# Patient Record
Sex: Male | Born: 1945 | Race: White | Hispanic: No | Marital: Married | Smoking: Former smoker
Health system: Southern US, Community
[De-identification: ages and names within clinical notes are randomized; demographics above are authoritative.]

## PROBLEM LIST (undated history)

## (undated) DIAGNOSIS — E785 Hyperlipidemia, unspecified: Secondary | ICD-10-CM

## (undated) DIAGNOSIS — I10 Essential (primary) hypertension: Secondary | ICD-10-CM

## (undated) DIAGNOSIS — E119 Type 2 diabetes mellitus without complications: Secondary | ICD-10-CM

## (undated) HISTORY — PX: EYE SURGERY: SHX253

## (undated) HISTORY — PX: BACK SURGERY: SHX140

---

## 2005-01-12 DIAGNOSIS — I214 Non-ST elevation (NSTEMI) myocardial infarction: Secondary | ICD-10-CM

## 2005-01-12 HISTORY — PX: CARDIAC SURGERY: SHX584

## 2005-01-12 HISTORY — DX: Non-ST elevation (NSTEMI) myocardial infarction: I21.4

## 2005-01-26 ENCOUNTER — Ambulatory Visit: Payer: Self-pay | Admitting: Cardiology

## 2005-01-26 ENCOUNTER — Inpatient Hospital Stay (HOSPITAL_COMMUNITY): Admission: EM | Admit: 2005-01-26 | Discharge: 2005-02-06 | Payer: Self-pay | Admitting: Cardiology

## 2005-02-27 ENCOUNTER — Ambulatory Visit: Payer: Self-pay | Admitting: Cardiovascular Disease

## 2005-04-01 ENCOUNTER — Ambulatory Visit: Payer: Self-pay | Admitting: Cardiology

## 2006-03-10 ENCOUNTER — Ambulatory Visit: Payer: Self-pay | Admitting: Cardiology

## 2006-03-19 ENCOUNTER — Ambulatory Visit: Payer: Self-pay | Admitting: Cardiology

## 2017-04-13 ENCOUNTER — Encounter: Payer: Self-pay | Admitting: *Deleted

## 2017-04-14 ENCOUNTER — Telehealth: Payer: Self-pay | Admitting: Cardiovascular Disease

## 2017-04-14 ENCOUNTER — Encounter: Payer: Self-pay | Admitting: Cardiovascular Disease

## 2017-04-14 ENCOUNTER — Encounter: Payer: Self-pay | Admitting: *Deleted

## 2017-04-14 ENCOUNTER — Ambulatory Visit (INDEPENDENT_AMBULATORY_CARE_PROVIDER_SITE_OTHER): Payer: Medicare Other | Admitting: Cardiovascular Disease

## 2017-04-14 VITALS — BP 128/78 | HR 63 | Ht 71.0 in | Wt 238.0 lb

## 2017-04-14 DIAGNOSIS — Z01818 Encounter for other preprocedural examination: Secondary | ICD-10-CM

## 2017-04-14 DIAGNOSIS — E785 Hyperlipidemia, unspecified: Secondary | ICD-10-CM | POA: Diagnosis not present

## 2017-04-14 DIAGNOSIS — Z951 Presence of aortocoronary bypass graft: Secondary | ICD-10-CM | POA: Diagnosis not present

## 2017-04-14 DIAGNOSIS — I25708 Atherosclerosis of coronary artery bypass graft(s), unspecified, with other forms of angina pectoris: Secondary | ICD-10-CM

## 2017-04-14 DIAGNOSIS — I10 Essential (primary) hypertension: Secondary | ICD-10-CM

## 2017-04-14 NOTE — Progress Notes (Signed)
CARDIOLOGY CONSULT NOTE  Patient ID: Allen Hill. MRN: [id number] DOB/AGE: 72/24/1947 72 y.o.  Admit date: (Not on file) Primary Physician: Dr. Matthias Hughs Referring Physician: Dr. Noreene Larsson  Reason for Consultation: Preoperative risk stratification  HPI: Allen Hill. is a 72 y.o. male who is being seen today for the evaluation of preoperative risk stratification at the request of Voos, Synetta Shadow, MD.   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  He also has hypertension and hyperlipidemia.  He has chronic lower back pain which has failed epidural steroid injections, chiropractic care, and exercise treatment.  He is being considered for multilevel lumbar decompression from the L2 down to the sacrum.  ECG performed today demonstrates sinus rhythm with old inferolateral infarct and inferolateral T wave inversions.  The patient denies any symptoms of chest pain, palpitations, shortness of breath, lightheadedness, dizziness, leg swelling, orthopnea, PND, and syncope.  He underwent four-vessel CABG on 01/26/2005.  He brought me his operative report.  He had a LIMA to the LAD, SVG to PDA, and SVG to OM1 and OM 2.   No Known Allergies  Current Outpatient Medications  Medication Sig Dispense Refill  . aspirin EC 81 MG tablet Take 81 mg by mouth daily.    . celecoxib (CELEBREX) 100 MG capsule Take 100 mg by mouth daily.    . DULoxetine (CYMBALTA) 60 MG capsule Take 60 mg by mouth daily.    Marland Kitchen ezetimibe (ZETIA) 10 MG tablet Take 10 mg by mouth daily.    Marland Kitchen lisinopril (PRINIVIL,ZESTRIL) 20 MG tablet Take 20 mg by mouth daily.    . metoprolol succinate (TOPROL-XL) 50 MG 24 hr tablet Take 25 mg by mouth daily. Take with or immediately following a meal.     . OXYCODONE HCL PO Take 5 mg by mouth.    . prazosin (MINIPRESS) 2 MG capsule Take 2 mg by mouth at bedtime.    . simvastatin (ZOCOR) 80 MG tablet Take 40 mg by mouth daily.      No current  facility-administered medications for this visit.     History reviewed. No pertinent past medical history.  History reviewed. No pertinent surgical history.  Social History   Socioeconomic History  . Marital status: Married    Spouse name: Not on file  . Number of children: Not on file  . Years of education: Not on file  . Highest education level: Not on file  Occupational History  . Not on file  Social Needs  . Financial resource strain: Not on file  . Food insecurity:    Worry: Not on file    Inability: Not on file  . Transportation needs:    Medical: Not on file    Non-medical: Not on file  Tobacco Use  . Smoking status: Former Smoker    Types: Cigarettes  . Smokeless tobacco: Current User  . Tobacco comment: quit 13 years ago   Substance and Sexual Activity  . Alcohol use: Not on file  . Drug use: Not on file  . Sexual activity: Not on file  Lifestyle  . Physical activity:    Days per week: Not on file    Minutes per session: Not on file  . Stress: Not on file  Relationships  . Social connections:    Talks on phone: Not on file    Gets together: Not on file    Attends religious service: Not on file  Active member of club or organization: Not on file    Attends meetings of clubs or organizations: Not on file    Relationship status: Not on file  . Intimate partner violence:    Fear of current or ex partner: Not on file    Emotionally abused: Not on file    Physically abused: Not on file    Forced sexual activity: Not on file  Other Topics Concern  . Not on file  Social History Narrative  . Not on file     No family history of premature CAD in 1st degree relatives.  Current Meds  Medication Sig  . aspirin EC 81 MG tablet Take 81 mg by mouth daily.  . celecoxib (CELEBREX) 100 MG capsule Take 100 mg by mouth daily.  . DULoxetine (CYMBALTA) 60 MG capsule Take 60 mg by mouth daily.  Marland Kitchen ezetimibe (ZETIA) 10 MG tablet Take 10 mg by mouth daily.  Marland Kitchen  lisinopril (PRINIVIL,ZESTRIL) 20 MG tablet Take 20 mg by mouth daily.  . metoprolol succinate (TOPROL-XL) 50 MG 24 hr tablet Take 25 mg by mouth daily. Take with or immediately following a meal.   . OXYCODONE HCL PO Take 5 mg by mouth.  . prazosin (MINIPRESS) 2 MG capsule Take 2 mg by mouth at bedtime.  . simvastatin (ZOCOR) 80 MG tablet Take 40 mg by mouth daily.       Review of systems complete and found to be negative unless listed above in HPI    Physical exam Blood pressure 128/78, pulse 63, height 5\' 11"  (1.803 m), weight 238 lb (108 kg), SpO2 96 %. General: NAD Neck: No JVD, no thyromegaly or thyroid nodule.  Lungs: Clear to auscultation bilaterally with normal respiratory effort. CV: Nondisplaced PMI. Regular rate and rhythm, normal S1/S2, no S3/S4, no murmur.  No peripheral edema.  No carotid bruit. Abdomen: Soft, nontender, no distention.  Skin: Intact without lesions or rashes.  Neurologic: Alert and oriented x 3.  Psych: Normal affect. Extremities: No clubbing or cyanosis.  HEENT: Normal.   ECG: Most recent ECG reviewed.   Labs: No results found for: K, BUN, CREATININE, ALT, TSH, HGB   Lipids: No results found for: LDLCALC, LDLDIRECT, CHOL, TRIG, HDL      ASSESSMENT AND PLAN:  1.  Preoperative risk stratification: I am unable to assess his exercise tolerance as he is limited by severe lower back pain.  I will proceed with a Lexiscan Myoview to further characterize his perioperative risk.  2.  Coronary artery disease with history of four-vessel CABG in 2007: Symptomatically stable.  Continue aspirin, lisinopril, metoprolol succinate, simvastatin, and Zetia.  I will obtain a Lexiscan Myoview to assess for any significant ischemic territories given that his bypass was 12 years ago.  3.  Hypertension: Blood pressure is controlled.  No changes to therapy.  4.  Hyperlipidemia: Currently on simvastatin 80 mg.  I will obtain a copy of lipids from the  [place].     Disposition: Follow up in 6 months  Signed: Kate Sable, M.D., F.A.C.C.  04/14/2017, 8:47 AM

## 2017-04-14 NOTE — Telephone Encounter (Signed)
Pre-cert Verification for the following procedure   Lexiscan scheduled for 04/15/17 at [facility]

## 2017-04-14 NOTE — Patient Instructions (Signed)
Medication Instructions:  Continue all current medications.  Labwork: none  Testing/Procedures:  Your physician has requested that you have a lexiscan myoview. For further information please visit [email or web address]. Please follow instruction sheet, as given.  Office will contact with results via phone or letter.    Follow-Up: Your physician wants you to follow up in: 6 months.  You will receive a reminder letter in the mail one-two months in advance.  If you don't receive a letter, please call our office to schedule the follow up appointment   Any Other Special Instructions Will Be Listed Below (If Applicable).  If you need a refill on your cardiac medications before your next appointment, please call your pharmacy.  

## 2017-04-15 ENCOUNTER — Ambulatory Visit (HOSPITAL_COMMUNITY)
Admission: RE | Admit: 2017-04-15 | Discharge: 2017-04-15 | Disposition: A | Discharge status: Home / Self Care | Payer: Medicare Other | Source: Ambulatory Visit | Attending: Cardiovascular Disease | Admitting: Cardiovascular Disease

## 2017-04-15 ENCOUNTER — Encounter (HOSPITAL_BASED_OUTPATIENT_CLINIC_OR_DEPARTMENT_OTHER)
Admission: RE | Admit: 2017-04-15 | Discharge: 2017-04-15 | Disposition: A | Discharge status: Home / Self Care | Payer: Medicare Other | Source: Ambulatory Visit | Attending: Cardiovascular Disease | Admitting: Cardiovascular Disease

## 2017-04-15 ENCOUNTER — Encounter: Payer: Self-pay | Admitting: Cardiovascular Disease

## 2017-04-15 ENCOUNTER — Encounter (HOSPITAL_COMMUNITY): Payer: Self-pay

## 2017-04-15 DIAGNOSIS — Z01818 Encounter for other preprocedural examination: Secondary | ICD-10-CM

## 2017-04-15 DIAGNOSIS — Z951 Presence of aortocoronary bypass graft: Secondary | ICD-10-CM | POA: Diagnosis not present

## 2017-04-15 DIAGNOSIS — I519 Heart disease, unspecified: Secondary | ICD-10-CM | POA: Diagnosis not present

## 2017-04-15 LAB — NM MYOCAR MULTI W/SPECT W/WALL MOTION / EF
CHL CUP NUCLEAR SSS: 5
CSEPPHR: 69 {beats}/min
LV sys vol: 68 mL
LVDIAVOL: 133 mL (ref 62–150)
NUC STRESS TID: 1.13
RATE: 0.76
Rest HR: 48 {beats}/min
SDS: 0
SRS: 5

## 2017-04-15 MED ORDER — TECHNETIUM TC 99M TETROFOSMIN IV KIT
10.0000 | PACK | Freq: Once | INTRAVENOUS | Status: AC | PRN
  Administered 2017-04-15: 10 via INTRAVENOUS

## 2017-04-15 MED ORDER — REGADENOSON 0.4 MG/5ML IV SOLN
INTRAVENOUS | Status: AC
  Administered 2017-04-15: 0.4 mg via INTRAVENOUS
  Filled 2017-04-15: qty 5

## 2017-04-15 MED ORDER — TECHNETIUM TC 99M TETROFOSMIN IV KIT
30.0000 | PACK | Freq: Once | INTRAVENOUS | Status: AC | PRN
  Administered 2017-04-15: 30 via INTRAVENOUS

## 2017-04-15 MED ORDER — SODIUM CHLORIDE 0.9% FLUSH
INTRAVENOUS | Status: AC
  Administered 2017-04-15: 10 mL via INTRAVENOUS
  Filled 2017-04-15: qty 10

## 2017-04-16 ENCOUNTER — Telehealth: Payer: Self-pay | Admitting: Cardiovascular Disease

## 2017-04-16 NOTE — Telephone Encounter (Signed)
[facility] called in reference to Mr. Stirewalt being cleared for surgery.  Please call [phone]. [phone] ask for Vicente Males.

## 2017-04-16 NOTE — Telephone Encounter (Signed)
Office closed at 4:30.

## 2017-04-19 ENCOUNTER — Telehealth: Payer: Self-pay | Admitting: *Deleted

## 2017-04-19 NOTE — Telephone Encounter (Signed)
Records faxed.

## 2017-04-19 NOTE — Telephone Encounter (Signed)
Called patient with test results. No answer. Left message to call back.  

## 2017-04-19 NOTE — Telephone Encounter (Signed)
-----   Message from Herminio Commons, MD sent at 04/15/2017  4:02 PM EDT ----- Evidence of prior heart attack.  No new blockages. He can proceed with surgery with an acceptable level of risk.

## 2017-04-20 NOTE — Telephone Encounter (Signed)
Called pt. No answer, left message for pt to return call.  

## 2017-04-20 NOTE — Telephone Encounter (Signed)
-----   Message from Herminio Commons, MD sent at 04/15/2017  4:02 PM EDT ----- Evidence of prior heart attack.  No new blockages. He can proceed with surgery with an acceptable level of risk.

## 2018-01-12 DIAGNOSIS — Z951 Presence of aortocoronary bypass graft: Secondary | ICD-10-CM | POA: Insufficient documentation

## 2018-02-28 ENCOUNTER — Telehealth: Payer: Self-pay | Admitting: Cardiovascular Disease

## 2018-02-28 NOTE — Telephone Encounter (Signed)
Numerous attempts to contact patient with recall letters. Unable to reach by telephone. with no success.    [[id number]] 04/19/2017 2:49 PM New [10]    [System] 07/12/2017 11:02 PM Notification Sent [20]    09/02/2017 11:26 AM Notification Sent [20]    02/28/2018 1:04 PM Notification Sent [20]

## 2019-11-08 IMAGING — NM NM MYOCAR MULTI W/SPECT W/WALL MOTION & EF
2 series · 12 of 12 positions shown · non-contrast
Comparison: none

[Series 1: rest · 6.51mm/px · 6 of 64 frames shown]
[frame 6/64]
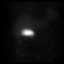
[frame 16/64]
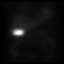
[frame 27/64]
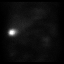
[frame 38/64]
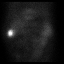
[frame 48/64]
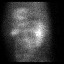
[frame 59/64]
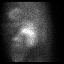

[Series 3: stress gated - perfusion · 6.51mm/px · 6 of 64 frames shown]
[frame 6/64]
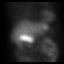
[frame 16/64]
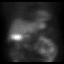
[frame 27/64]
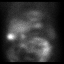
[frame 38/64]
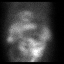
[frame 48/64]
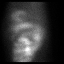
[frame 59/64]
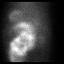

[12 of 12 positions shown; findings below may reference images not displayed]

Canned report from images found in remote index.

Refer to host system for actual result text.

## 2019-11-20 DIAGNOSIS — D369 Benign neoplasm, unspecified site: Secondary | ICD-10-CM | POA: Insufficient documentation

## 2020-06-17 ENCOUNTER — Other Ambulatory Visit: Payer: Self-pay

## 2020-06-17 ENCOUNTER — Emergency Department (HOSPITAL_COMMUNITY)
Admission: EM | Admit: 2020-06-17 | Discharge: 2020-06-17 | Disposition: A | Discharge status: Home / Self Care | Payer: Medicare Other | Attending: Emergency Medicine | Admitting: Emergency Medicine

## 2020-06-17 ENCOUNTER — Encounter (HOSPITAL_COMMUNITY): Payer: Self-pay | Admitting: Emergency Medicine

## 2020-06-17 DIAGNOSIS — M549 Dorsalgia, unspecified: Secondary | ICD-10-CM | POA: Insufficient documentation

## 2020-06-17 DIAGNOSIS — Z5321 Procedure and treatment not carried out due to patient leaving prior to being seen by health care provider: Secondary | ICD-10-CM | POA: Diagnosis not present

## 2020-06-17 DIAGNOSIS — B9689 Other specified bacterial agents as the cause of diseases classified elsewhere: Secondary | ICD-10-CM | POA: Diagnosis not present

## 2020-06-17 DIAGNOSIS — N390 Urinary tract infection, site not specified: Secondary | ICD-10-CM | POA: Insufficient documentation

## 2020-06-17 DIAGNOSIS — R531 Weakness: Secondary | ICD-10-CM | POA: Insufficient documentation

## 2020-06-17 DIAGNOSIS — R509 Fever, unspecified: Secondary | ICD-10-CM | POA: Insufficient documentation

## 2020-06-17 HISTORY — DX: Type 2 diabetes mellitus without complications: E11.9

## 2020-06-17 HISTORY — DX: Hyperlipidemia, unspecified: E78.5

## 2020-06-17 HISTORY — DX: Essential (primary) hypertension: I10

## 2020-06-17 NOTE — ED Triage Notes (Signed)
Pt was at [place] a couple weeks ago. And was told he had a UTI and was given abx. He has been c/o back pain and weakness. Low fever at home.

## 2020-06-21 ENCOUNTER — Encounter (HOSPITAL_COMMUNITY): Payer: Self-pay

## 2020-06-21 ENCOUNTER — Other Ambulatory Visit: Payer: Self-pay

## 2020-06-21 ENCOUNTER — Emergency Department (HOSPITAL_COMMUNITY)
Admission: EM | Admit: 2020-06-21 | Discharge: 2020-06-21 | Disposition: A | Discharge status: Home / Self Care | Payer: Medicare Other | Attending: Emergency Medicine | Admitting: Emergency Medicine

## 2020-06-21 DIAGNOSIS — R350 Frequency of micturition: Secondary | ICD-10-CM | POA: Diagnosis not present

## 2020-06-21 DIAGNOSIS — Z7982 Long term (current) use of aspirin: Secondary | ICD-10-CM | POA: Insufficient documentation

## 2020-06-21 DIAGNOSIS — E119 Type 2 diabetes mellitus without complications: Secondary | ICD-10-CM | POA: Insufficient documentation

## 2020-06-21 DIAGNOSIS — Z79899 Other long term (current) drug therapy: Secondary | ICD-10-CM | POA: Diagnosis not present

## 2020-06-21 DIAGNOSIS — R339 Retention of urine, unspecified: Secondary | ICD-10-CM | POA: Diagnosis not present

## 2020-06-21 DIAGNOSIS — R001 Bradycardia, unspecified: Secondary | ICD-10-CM | POA: Diagnosis not present

## 2020-06-21 DIAGNOSIS — I10 Essential (primary) hypertension: Secondary | ICD-10-CM | POA: Diagnosis not present

## 2020-06-21 DIAGNOSIS — Z87891 Personal history of nicotine dependence: Secondary | ICD-10-CM | POA: Insufficient documentation

## 2020-06-21 LAB — CBC WITH DIFFERENTIAL/PLATELET
Abs Immature Granulocytes: 0.13 10*3/uL — ABNORMAL HIGH (ref 0.00–0.07)
Basophils Absolute: 0 10*3/uL (ref 0.0–0.1)
Basophils Relative: 0 %
Eosinophils Absolute: 0.1 10*3/uL (ref 0.0–0.5)
Eosinophils Relative: 2 %
HCT: 38 % — ABNORMAL LOW (ref 39.0–52.0)
Hemoglobin: 12.6 g/dL — ABNORMAL LOW (ref 13.0–17.0)
Immature Granulocytes: 2 %
Lymphocytes Relative: 16 %
Lymphs Abs: 1.4 10*3/uL (ref 0.7–4.0)
MCH: 33.8 pg (ref 26.0–34.0)
MCHC: 33.2 g/dL (ref 30.0–36.0)
MCV: 101.9 fL — ABNORMAL HIGH (ref 80.0–100.0)
Monocytes Absolute: 0.4 10*3/uL (ref 0.1–1.0)
Monocytes Relative: 5 %
Neutro Abs: 6.3 10*3/uL (ref 1.7–7.7)
Neutrophils Relative %: 75 %
Platelets: 238 10*3/uL (ref 150–400)
RBC: 3.73 MIL/uL — ABNORMAL LOW (ref 4.22–5.81)
RDW: 12.6 % (ref 11.5–15.5)
WBC: 8.3 10*3/uL (ref 4.0–10.5)
nRBC: 0 % (ref 0.0–0.2)

## 2020-06-21 LAB — URINALYSIS, ROUTINE W REFLEX MICROSCOPIC
Bilirubin Urine: NEGATIVE
Glucose, UA: NEGATIVE mg/dL
Hgb urine dipstick: NEGATIVE
Ketones, ur: NEGATIVE mg/dL
Leukocytes,Ua: NEGATIVE
Nitrite: NEGATIVE
Protein, ur: NEGATIVE mg/dL
Specific Gravity, Urine: 1.012 (ref 1.005–1.030)
pH: 7 (ref 5.0–8.0)

## 2020-06-21 LAB — BASIC METABOLIC PANEL
Anion gap: 7 (ref 5–15)
BUN: 15 mg/dL (ref 8–23)
CO2: 27 mmol/L (ref 22–32)
Calcium: 8.4 mg/dL — ABNORMAL LOW (ref 8.9–10.3)
Chloride: 99 mmol/L (ref 98–111)
Creatinine, Ser: 1.26 mg/dL — ABNORMAL HIGH (ref 0.61–1.24)
GFR, Estimated: 59 mL/min — ABNORMAL LOW (ref >60)
Glucose, Bld: 120 mg/dL — ABNORMAL HIGH (ref 70–99)
Potassium: 4.6 mmol/L (ref 3.5–5.1)
Sodium: 133 mmol/L — ABNORMAL LOW (ref 135–145)

## 2020-06-21 NOTE — ED Notes (Signed)
Pt wife is at bedside. Said urinary retention is not the main concern. Was treated 3 weeks ago for UTI. States pt has decreased appetite and increased weakness.

## 2020-06-21 NOTE — ED Triage Notes (Signed)
Pt unable to void, pt states his urine smells terrible.  Pain 9/10, pt has history of this problem before.

## 2020-06-21 NOTE — ED Provider Notes (Signed)
[facility] EMERGENCY DEPARTMENT Provider Note   CSN: [id number] Arrival date & time: 06/21/20  4888     History Chief Complaint  Patient presents with   Abdominal Pain    Urinary Rentention     Allen Hill. is a 75 y.o. male.  HPI Is here for difficulty voiding, and complains of foul-smelling urine.  He describes ongoing frequency of urination, dribbling, and feeling like he cannot urinate enough.  He denies being checked for prostate problems in the past although he is talked to his doctor about it.  He denies fever, chills, chest pain, weakness or dizziness.  He is not having any abdominal pain.  He is able ambulate came here by private vehicle.  He cannot recall all of his usual medications and requests that I ask his wife about it.  There are no other known active modifying factors.    Past Medical History:  Diagnosis Date   Diabetes mellitus without complication ([place])    Hyperlipidemia    Hypertension     There are no problems to display for this patient.   Past Surgical History:  Procedure Laterality Date   BACK SURGERY     CARDIAC SURGERY     EYE SURGERY         History reviewed. No pertinent family history.  Social History   Tobacco Use   Smoking status: Former    Pack years: 0.00    Types: Cigarettes   Smokeless tobacco: Current   Tobacco comments:    quit 13 years ago   Substance Use Topics   Alcohol use: Never   Drug use: Never    Home Medications Prior to Admission medications   Medication Sig Start Date End Date Taking? Authorizing Provider  aspirin EC 81 MG tablet Take 81 mg by mouth daily.    [provider]  celecoxib (CELEBREX) 100 MG capsule Take 100 mg by mouth daily.    [provider]  DULoxetine (CYMBALTA) 60 MG capsule Take 60 mg by mouth daily.    [provider]  ezetimibe (ZETIA) 10 MG tablet Take 10 mg by mouth daily.    [provider]  lisinopril (PRINIVIL,ZESTRIL) 20 MG tablet Take  20 mg by mouth daily.    [provider]  metoprolol succinate (TOPROL-XL) 50 MG 24 hr tablet Take 25 mg by mouth daily. Take with or immediately following a meal.     [provider]  OXYCODONE HCL PO Take 5 mg by mouth.    [provider]  prazosin (MINIPRESS) 2 MG capsule Take 2 mg by mouth at bedtime.    [provider]  simvastatin (ZOCOR) 80 MG tablet Take 40 mg by mouth daily.     [provider]    Allergies    Patient has no known allergies.  Review of Systems   Review of Systems  All other systems reviewed and are negative.  Physical Exam Updated Vital Signs BP 107/63   Pulse (!) 57   Temp 98.6 F (37 C) (Oral)   Resp 16   Ht 5\' 11"  (1.803 m)   Wt 106 kg   SpO2 99%   BMI 32.59 kg/m   Physical Exam Vitals and nursing note reviewed.  Constitutional:      General: He is not in acute distress.    Appearance: He is well-developed. He is not ill-appearing.  HENT:     Head: Normocephalic and atraumatic.     Right Ear:  External ear normal.     Left Ear: External ear normal.  Eyes:     Conjunctiva/sclera: Conjunctivae normal.     Pupils: Pupils are equal, round, and reactive to light.  Neck:     Trachea: Phonation normal.  Cardiovascular:     Rate and Rhythm: Normal rate and regular rhythm.     Heart sounds: Normal heart sounds.  Pulmonary:     Effort: Pulmonary effort is normal.     Breath sounds: Normal breath sounds.  Abdominal:     General: There is no distension.     Palpations: Abdomen is soft. There is no mass.     Tenderness: There is no abdominal tenderness.  Musculoskeletal:        General: No swelling. Normal range of motion.     Cervical back: Normal range of motion and neck supple.  Skin:    General: Skin is warm and dry.     Coloration: Skin is not jaundiced or pale.  Neurological:     Mental Status: He is alert and oriented to person, place, and time.     Cranial Nerves: No cranial nerve deficit.      Sensory: No sensory deficit.     Motor: No abnormal muscle tone.     Coordination: Coordination normal.  Psychiatric:        Mood and Affect: Mood normal.        Behavior: Behavior normal.        Thought Content: Thought content normal.        Judgment: Judgment normal.    ED Results / Procedures / Treatments   Labs (all labs ordered are listed, but only abnormal results are displayed) Labs Reviewed  BASIC METABOLIC PANEL - Abnormal; Notable for the following components:      Result Value   Sodium 133 (*)    Glucose, Bld 120 (*)    Creatinine, Ser 1.26 (*)    Calcium 8.4 (*)    GFR, Estimated 59 (*)    All other components within normal limits  CBC WITH DIFFERENTIAL/PLATELET - Abnormal; Notable for the following components:   RBC 3.73 (*)    Hemoglobin 12.6 (*)    HCT 38.0 (*)    MCV 101.9 (*)    Abs Immature Granulocytes 0.13 (*)    All other components within normal limits  URINALYSIS, ROUTINE W REFLEX MICROSCOPIC - Abnormal; Notable for the following components:   APPearance HAZY (*)    All other components within normal limits    EKG EKG Interpretation  Date/Time:  Friday June 21 2020 12:44:04 EDT Ventricular Rate:  37 PR Interval:    QRS Duration: 104 QT Interval:  474 QTC Calculation: 372 R Axis:   12 Text Interpretation: Sinus bradycardia Sinus arrhythmia Abnormal R-wave progression, early transition Inferior infarct, old Since last tracing rate slower Otherwise no significant change Confirmed by Daleen Bo [id number]) on 06/21/2020 1:00:56 PM  Radiology No results found.  Procedures Procedures   Medications Ordered in ED Medications - No data to display  ED Course  I have reviewed the triage vital signs and the nursing notes.  Pertinent labs & imaging results that were available during my care of the patient were reviewed by me and considered in my medical decision making (see chart for details).    MDM Rules/Calculators/A&P                            Patient  Vitals for the past 24 hrs:  BP Temp Temp src Pulse Resp SpO2 Height Weight  06/21/20 1230 107/63 -- -- (!) 57 -- 99 % -- --  06/21/20 1222 (!) 92/57 -- -- 80 16 100 % -- --  06/21/20 1019 (!) 129/56 -- -- (!) 58 20 99 % -- --  06/21/20 0907 (!) 129/56 -- -- (!) 58 20 99 % -- --  06/21/20 0900 (!) 129/56 -- -- (!) 58 -- 96 % -- --  06/21/20 0753 -- -- -- -- -- -- 5\' 11"  (1.803 m) 106 kg  06/21/20 0752 (!) 125/57 98.6 F (37 C) Oral 100 18 98 % -- --    1:02 PM Reevaluation with update and discussion. After initial assessment and treatment, an updated evaluation reveals patient was noted to have bradycardia and hypotension, EKG ordered, which shows sinus bradycardia. Daleen Bo   Medical Decision Making:  This patient is presenting for evaluation of urinary voiding is difficult, which does require a range of treatment options, and is a complaint that involves a moderate risk of morbidity and mortality. The differential diagnoses include UTI, prostatitis, bladder outlet obstruction. I decided to review old records, and in summary 75 year old male with ongoing difficulty urinating, presenting now with a sensation of incomplete voiding and frequency of voiding.  I obtained additional historical information from wife at bedside.  Clinical Laboratory Tests Ordered, included CBC, Metabolic panel, and Urinalysis. Review indicates normal except glucose high, creatinine high, calcium low, GFR low, hemoglobin low, MCV high.    I ordered and Reviewed the bladder scan medicine Test.  Initial bladder scan elevated at 234 mL.  Post void residual bladder scan, improved, 90 mL  Critical Interventions-clinical evaluation, laboratory testing, observation, EKG, reevaluation and discussion  After These Interventions, the Patient was reevaluated and was found with normal urinalysis and urinary bladder scan.  Doubt UTI or bladder outlet obstruction.  He is currently on finasteride for  prostatic problems.  Patient was noted to have incidental bradycardia and hypotension.  He took his Toprol this morning.  Ambulation trial for discharge.  CRITICAL CARE-no Performed by: Daleen Bo  Nursing Notes Reviewed/ Care Coordinated Applicable Imaging Reviewed Interpretation of Laboratory Data incorporated into ED treatment  The patient appears reasonably screened and/or stabilized for discharge and I doubt any other medical condition or other [facility] requiring further screening, evaluation, or treatment in the ED at this time prior to discharge.  Plan: Home Medications-continue usual except stop metoprolol; Home Treatments-rest, gradual advance diet and activity; return here if the recommended treatment, does not improve the symptoms; Recommended follow up-primary care or cardiology follow-up regarding bradycardia and cessation of metoprolol.  Urology for urinary symptoms.     Final Clinical Impression(s) / ED Diagnoses Final diagnoses:  Urine frequency  Bradycardia    Rx / DC Orders ED Discharge Orders     None        Daleen Bo, MD 06/22/20 1007

## 2020-06-21 NOTE — Discharge Instructions (Addendum)
There were no signs of urinary tract infection or blockage of urine output.  To improve the odor of urine try to drink more fluids and eat a regular diet.  Your heart rate and blood pressure were low but improved when he walked.  Stop taking the metoprolol, for now, to see if that helps.  You may need to alter your other blood pressure medicines, lisinopril and prazosin.  Talk to your doctor about your medicines when you see them next Wednesday.

## 2020-07-23 DIAGNOSIS — N138 Other obstructive and reflux uropathy: Secondary | ICD-10-CM | POA: Insufficient documentation

## 2021-10-12 HISTORY — PX: CATARACT EXTRACTION: SUR2

## 2021-11-12 DIAGNOSIS — H3530 Unspecified macular degeneration: Secondary | ICD-10-CM

## 2021-11-12 HISTORY — PX: CATARACT EXTRACTION: SUR2

## 2021-11-12 HISTORY — DX: Unspecified macular degeneration: H35.30

## 2022-02-27 DIAGNOSIS — Y9279 Other farm location as the place of occurrence of the external cause: Secondary | ICD-10-CM | POA: Insufficient documentation

## 2022-03-01 DIAGNOSIS — S2241XA Multiple fractures of ribs, right side, initial encounter for closed fracture: Secondary | ICD-10-CM | POA: Insufficient documentation

## 2022-03-01 DIAGNOSIS — S2242XA Multiple fractures of ribs, left side, initial encounter for closed fracture: Secondary | ICD-10-CM | POA: Insufficient documentation

## 2022-03-09 ENCOUNTER — Ambulatory Visit (INDEPENDENT_AMBULATORY_CARE_PROVIDER_SITE_OTHER): Payer: Medicare Other | Admitting: Family Medicine

## 2022-03-09 ENCOUNTER — Encounter: Payer: Self-pay | Admitting: Family Medicine

## 2022-03-09 VITALS — BP 136/64 | HR 57 | Temp 98.8°F | Ht 71.0 in | Wt 224.2 lb

## 2022-03-09 DIAGNOSIS — E785 Hyperlipidemia, unspecified: Secondary | ICD-10-CM | POA: Insufficient documentation

## 2022-03-09 DIAGNOSIS — Y9279 Other farm location as the place of occurrence of the external cause: Secondary | ICD-10-CM | POA: Diagnosis not present

## 2022-03-09 DIAGNOSIS — I214 Non-ST elevation (NSTEMI) myocardial infarction: Secondary | ICD-10-CM

## 2022-03-09 DIAGNOSIS — S2241XD Multiple fractures of ribs, right side, subsequent encounter for fracture with routine healing: Secondary | ICD-10-CM | POA: Diagnosis not present

## 2022-03-09 DIAGNOSIS — G4733 Obstructive sleep apnea (adult) (pediatric): Secondary | ICD-10-CM

## 2022-03-09 DIAGNOSIS — M47817 Spondylosis without myelopathy or radiculopathy, lumbosacral region: Secondary | ICD-10-CM | POA: Insufficient documentation

## 2022-03-09 DIAGNOSIS — K589 Irritable bowel syndrome without diarrhea: Secondary | ICD-10-CM

## 2022-03-09 DIAGNOSIS — H903 Sensorineural hearing loss, bilateral: Secondary | ICD-10-CM

## 2022-03-09 DIAGNOSIS — F4310 Post-traumatic stress disorder, unspecified: Secondary | ICD-10-CM

## 2022-03-09 DIAGNOSIS — H25813 Combined forms of age-related cataract, bilateral: Secondary | ICD-10-CM | POA: Insufficient documentation

## 2022-03-09 DIAGNOSIS — Z87891 Personal history of nicotine dependence: Secondary | ICD-10-CM | POA: Insufficient documentation

## 2022-03-09 DIAGNOSIS — Z09 Encounter for follow-up examination after completed treatment for conditions other than malignant neoplasm: Secondary | ICD-10-CM

## 2022-03-09 DIAGNOSIS — N529 Male erectile dysfunction, unspecified: Secondary | ICD-10-CM | POA: Insufficient documentation

## 2022-03-09 DIAGNOSIS — N400 Enlarged prostate without lower urinary tract symptoms: Secondary | ICD-10-CM

## 2022-03-09 DIAGNOSIS — S2242XD Multiple fractures of ribs, left side, subsequent encounter for fracture with routine healing: Secondary | ICD-10-CM

## 2022-03-09 DIAGNOSIS — R001 Bradycardia, unspecified: Secondary | ICD-10-CM

## 2022-03-09 DIAGNOSIS — I10 Essential (primary) hypertension: Secondary | ICD-10-CM | POA: Insufficient documentation

## 2022-03-09 DIAGNOSIS — H269 Unspecified cataract: Secondary | ICD-10-CM | POA: Insufficient documentation

## 2022-03-09 DIAGNOSIS — F5105 Insomnia due to other mental disorder: Secondary | ICD-10-CM

## 2022-03-09 DIAGNOSIS — Z9989 Dependence on other enabling machines and devices: Secondary | ICD-10-CM

## 2022-03-09 HISTORY — DX: Obstructive sleep apnea (adult) (pediatric): G47.33

## 2022-03-09 HISTORY — DX: Sensorineural hearing loss, bilateral: H90.3

## 2022-03-09 HISTORY — DX: Dependence on other enabling machines and devices: Z99.89

## 2022-03-09 HISTORY — DX: Irritable bowel syndrome, unspecified: K58.9

## 2022-03-09 HISTORY — DX: Benign prostatic hyperplasia without lower urinary tract symptoms: N40.0

## 2022-03-09 HISTORY — DX: Combined forms of age-related cataract, bilateral: H25.813

## 2022-03-09 HISTORY — DX: Essential (primary) hypertension: I10

## 2022-03-09 HISTORY — DX: Spondylosis without myelopathy or radiculopathy, lumbosacral region: M47.817

## 2022-03-09 HISTORY — DX: Post-traumatic stress disorder, unspecified: F43.10

## 2022-03-09 HISTORY — DX: Insomnia due to other mental disorder: F51.05

## 2022-03-09 MED ORDER — LISINOPRIL 20 MG PO TABS: 10.0000 mg | ORAL_TABLET | Freq: Every day | ORAL | 0 refills | Status: AC

## 2022-03-09 MED ORDER — METOPROLOL SUCCINATE ER 25 MG PO TB24: 12.5000 mg | ORAL_TABLET | Freq: Every day | ORAL | 2 refills | Status: DC

## 2022-03-09 MED ORDER — OXYCODONE HCL 5 MG PO TABS: 5.0000 mg | ORAL_TABLET | Freq: Four times a day (QID) | ORAL | 0 refills | Status: DC | PRN
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
## 2022-04-06 ENCOUNTER — Encounter: Payer: Self-pay | Admitting: *Deleted

## 2022-04-07 ENCOUNTER — Encounter: Payer: Self-pay | Admitting: Internal Medicine

## 2022-04-07 ENCOUNTER — Ambulatory Visit: Payer: Medicare Other | Attending: Internal Medicine | Admitting: Internal Medicine

## 2022-04-07 VITALS — BP 132/80 | HR 68 | Ht 71.0 in | Wt 234.2 lb

## 2022-04-07 DIAGNOSIS — Z951 Presence of aortocoronary bypass graft: Secondary | ICD-10-CM

## 2022-04-07 DIAGNOSIS — I25810 Atherosclerosis of coronary artery bypass graft(s) without angina pectoris: Secondary | ICD-10-CM

## 2022-04-07 DIAGNOSIS — E782 Mixed hyperlipidemia: Secondary | ICD-10-CM

## 2022-04-07 DIAGNOSIS — R001 Bradycardia, unspecified: Secondary | ICD-10-CM

## 2022-04-07 DIAGNOSIS — I10 Essential (primary) hypertension: Secondary | ICD-10-CM | POA: Diagnosis not present

## 2022-04-07 DIAGNOSIS — Z789 Other specified health status: Secondary | ICD-10-CM

## 2022-04-07 MED ORDER — ASPIRIN 81 MG PO TBEC: 81.0000 mg | DELAYED_RELEASE_TABLET | Freq: Every day | ORAL | 3 refills | Status: AC

## 2022-04-07 NOTE — Patient Instructions (Addendum)
Medication Instructions:  Your physician has recommended you make the following change in your medication:  Stop metoprolol Change aspirin 81 mg to daily Continue other medications the same  Labwork: none  Testing/Procedures: Your physician has requested that you have an echocardiogram. Echocardiography is a painless test that uses sound waves to create images of your heart. It provides your doctor with information about the size and shape of your heart and how well your heart's chambers and valves are working. This procedure takes approximately one hour. There are no restrictions for this procedure. Please do NOT wear cologne, perfume, aftershave, or lotions (deodorant is allowed). Please arrive 15 minutes prior to your appointment time.  Follow-Up: Your physician recommends that you schedule a follow-up appointment in: 1 year. You will receive a reminder call in the mail in about 10 months reminding you to call and schedule your appointment. If you don't receive this call, please contact our office.  Any Other Special Instructions Will Be Listed Below (If Applicable). You have been referred to PharmD  If you need a refill on your cardiac medications before your next appointment, please call your pharmacy.
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
## 2022-04-30 ENCOUNTER — Ambulatory Visit: Payer: Medicare Other | Attending: Internal Medicine

## 2022-04-30 DIAGNOSIS — I25810 Atherosclerosis of coronary artery bypass graft(s) without angina pectoris: Secondary | ICD-10-CM

## 2022-04-30 LAB — ECHOCARDIOGRAM COMPLETE
AR max vel: 2.19 cm2
AV Area VTI: 2.53 cm2
AV Area mean vel: 2.57 cm2
AV Mean grad: 5 mmHg
AV Peak grad: 10.9 mmHg
Ao pk vel: 1.65 m/s
Area-P 1/2: 3.19 cm2
Calc EF: 57.8 %
MV M vel: 5.63 m/s
MV Peak grad: 126.9 mmHg
MV Vena cont: 0.1 cm
Radius: 0.3 cm
S' Lateral: 3.6 cm
Single Plane A2C EF: 59.6 %
Single Plane A4C EF: 60.2 %

## 2022-05-12 ENCOUNTER — Telehealth: Payer: Self-pay | Admitting: *Deleted

## 2022-05-12 NOTE — Telephone Encounter (Signed)
Lesle Chris, LPN 1/61/0960  4:54 AM EDT Back to Top    Notified, copy to pcp.

## 2022-05-12 NOTE — Telephone Encounter (Signed)
-----   Message from Marjo Bicker, MD sent at 05/07/2022 12:20 PM EDT ----- Normal pumping function of the heart, mild mitral valve regurgitation (there appears to be some prolapse of the mitral valve leaflet). Will monitor the mitral regurgitation every 3 years with echocardiogram.

## 2022-05-12 NOTE — Telephone Encounter (Signed)
Patient's wife informed and verbalized understanding of plan. 

## 2022-05-12 NOTE — Telephone Encounter (Signed)
Patient's wife is calling to get update on the results of echo. Requesting return call.

## 2022-05-20 ENCOUNTER — Telehealth: Payer: Self-pay

## 2022-05-20 ENCOUNTER — Ambulatory Visit: Payer: Medicare Other | Attending: Cardiology | Admitting: Pharmacist

## 2022-05-20 ENCOUNTER — Other Ambulatory Visit (HOSPITAL_COMMUNITY): Payer: Self-pay

## 2022-05-20 ENCOUNTER — Encounter: Payer: Self-pay | Admitting: Pharmacist

## 2022-05-20 ENCOUNTER — Telehealth: Payer: Self-pay | Admitting: Pharmacist

## 2022-05-20 VITALS — BP 131/77 | HR 81 | Ht 71.0 in | Wt 234.8 lb

## 2022-05-20 DIAGNOSIS — I214 Non-ST elevation (NSTEMI) myocardial infarction: Secondary | ICD-10-CM

## 2022-05-20 DIAGNOSIS — I25810 Atherosclerosis of coronary artery bypass graft(s) without angina pectoris: Secondary | ICD-10-CM | POA: Insufficient documentation

## 2022-05-20 DIAGNOSIS — Z789 Other specified health status: Secondary | ICD-10-CM | POA: Diagnosis not present

## 2022-05-20 DIAGNOSIS — E785 Hyperlipidemia, unspecified: Secondary | ICD-10-CM

## 2022-05-20 MED ORDER — PRALUENT 75 MG/ML ~~LOC~~ SOAJ
75.0000 mg | SUBCUTANEOUS | 11 refills | Status: DC
Start: 2022-05-20 — End: 2022-05-20

## 2022-05-20 MED ORDER — PRALUENT 75 MG/ML ~~LOC~~ SOAJ
75.0000 mg | SUBCUTANEOUS | 11 refills | Status: DC
Start: 2022-05-20 — End: 2023-03-22

## 2022-05-20 MED ORDER — REPATHA SURECLICK 140 MG/ML ~~LOC~~ SOAJ
1.0000 mL | SUBCUTANEOUS | 6 refills | Status: DC
Start: 2022-05-20 — End: 2022-09-10

## 2022-05-20 NOTE — Telephone Encounter (Signed)
Pharmacy Patient Advocate Encounter  Prior Authorization for Repatha 140mg /ml has been approved by [employer] (ins).    PA # [id number]   Effective dates: 1.1.24 through 12.31.24

## 2022-05-20 NOTE — Progress Notes (Signed)
Patient ID: Allen Hill.                 DOB: [birth year]                    MRN: [id number]     HPI: Allen Hill. is a 77 y.o. male patient referred to lipid clinic by Dr Jenene Slicker. PMH is significant for CAD, CABG, NSTEMI, HTN, HLD and statin intolerance.  Patient presents today with wife. Receives care at [facility] and [facility]. Receives most of his medications from the [place].   Has tried rosuvastatin and simvastatin and both caused severe myalgias. When he discontinued them the pain went away in about a week. Has been off lipid lowering therapy for about a year.  Had NSTEMI 17 years ago. Former smoker.   Current Medications: N/A  Intolerances:  Rosuvastatin Simvastatin Atorvastatin?  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 Reports marked improvement after ESI.   Macular degeneration 11/2021   NSTEMI (non-ST elevated myocardial infarction) ([facility]) 01/12/2005   Obstructive sleep apnea syndrome 03/09/2022   Post-traumatic stress disorder, unspecified 03/09/2022   Sensorineural hearing loss, bilateral 03/09/2022    Current Outpatient Medications on File Prior to Visit  Medication Sig Dispense Refill   metoprolol succinate (TOPROL-XL) 25 MG 24 hr tablet Take 25 mg by mouth.     mirtazapine (REMERON) 30 MG  tablet 30 mg.     acetaminophen (TYLENOL) 500 MG tablet Take 1,000 mg by mouth every 6 (six) hours as needed.     aspirin EC 81 MG tablet Take 1 tablet (81 mg total) by mouth daily. Swallow whole. 90 tablet 3   celecoxib (CELEBREX) 100 MG capsule Take 100 mg by mouth daily.     cetirizine (ZYRTEC) 10 MG tablet Take 10 mg by mouth at bedtime.     DULoxetine (CYMBALTA) 60 MG capsule Take 60 mg by mouth daily.     finasteride (PROSCAR) 5 MG tablet Take 5 mg by mouth daily.     lidocaine 4 % Place 1 patch onto the skin daily.     lisinopril (ZESTRIL) 20 MG tablet Take 0.5 tablets (10 mg total) by mouth daily. 45 tablet 0   mirtazapine (REMERON SOL-TAB) 45 MG disintegrating tablet 45 mg.     prazosin (MINIPRESS) 5 MG capsule Take 5 mg by mouth at bedtime.     Probiotic Product (DIGESTIVE ADVANTAGE GUMMIES) CHEW Chew by mouth.     tadalafil (CIALIS) 5 MG tablet Take 5 mg by mouth daily as needed.     No current facility-administered medications on file prior to visit.    Allergies  Allergen Reactions   Rosuvastatin Other (See Comments)   Statins Other (See Comments)    Assessment/Plan:  1. Hyperlipidemia - Patient 's last LDL was 132 which is  above goal of <70. Unfortunately is statin intolerant. Recommended starting PCSK9i.  Using demo pen, educated patient on mechanism of action, storage, site selection, administration, and possible adverse effects. Patient was able to demonstrate in room. Advised that his [employer] plan will prefer Repatha but the [facility] will prefer Praluent. Will complete Repatha PA and send to local pharmacy and also send Praluent Rx to [facility] for Dr Brett Canales to sign and can be faxed to [facility].  Recheck lipid panel in 2-3 months.  Laural Golden, PharmD, BCACP, CDCES, CPP [address], [address] [place], [place], [address] Phone: [phone], Fax: [phone]

## 2022-05-20 NOTE — Telephone Encounter (Signed)
Printing Rx for Dr Jenene Slicker to sign for the [place]

## 2022-05-20 NOTE — Patient Instructions (Addendum)
It was nice meeting you two today  We would like your LDL (bad cholesterol) to be less than 70  The medications we are recommending are called Repatha and Praluent. [employer] prefers Repatha, the [place] prefers Praluent.  I will complete the prior authorization for you and send the prescription to [facility]  I will also have a paper prescription for Praluent sent to the [facility] signed by Dr Jenene Slicker  Regardless, we will recheck your cholesterol in about 2-3 months  Please message or call with any questions  Laural Golden, PharmD, BCACP, CDCES, CPP [address] [place], [place], [address] Phone: [phone], Fax: [phone]

## 2022-05-20 NOTE — Telephone Encounter (Signed)
Pharmacy Patient Advocate Encounter   Received notification from Pharm-D that prior authorization for Repatha 140mg /ml is required/requested.   PA submitted on 5.8.24 to (ins) [employer] via CoverMyMeds Key confirmation # [id number]  Status is pending

## 2022-05-20 NOTE — Telephone Encounter (Signed)
Praluent rx printed, signed by [facility] and faxed to [facility] [phone]

## 2022-09-07 ENCOUNTER — Ambulatory Visit (INDEPENDENT_AMBULATORY_CARE_PROVIDER_SITE_OTHER): Payer: Medicare Other | Admitting: Family Medicine

## 2022-09-07 ENCOUNTER — Encounter: Payer: Self-pay | Admitting: Family Medicine

## 2022-09-07 VITALS — BP 132/83 | HR 74 | Temp 98.4°F | Resp 16 | Ht 71.0 in | Wt 238.2 lb

## 2022-09-07 DIAGNOSIS — F4310 Post-traumatic stress disorder, unspecified: Secondary | ICD-10-CM

## 2022-09-07 DIAGNOSIS — R7302 Impaired glucose tolerance (oral): Secondary | ICD-10-CM | POA: Diagnosis not present

## 2022-09-07 DIAGNOSIS — Z13220 Encounter for screening for lipoid disorders: Secondary | ICD-10-CM

## 2022-09-07 DIAGNOSIS — Z136 Encounter for screening for cardiovascular disorders: Secondary | ICD-10-CM

## 2022-09-07 DIAGNOSIS — Z0000 Encounter for general adult medical examination without abnormal findings: Secondary | ICD-10-CM | POA: Diagnosis not present

## 2022-09-07 MED ORDER — PRAZOSIN HCL 5 MG PO CAPS: 5.0000 mg | ORAL_CAPSULE | Freq: Every day | ORAL | 1 refills | Status: DC

## 2022-09-07 NOTE — Progress Notes (Signed)
Annual Wellness Visit     Patient: Allen Crossen., Male    DOB: 1945-10-01, 77 y.o.   MRN: [id number]  Subjective  Chief Complaint  Patient presents with   Medicare Wellness    Sub awv, pt would like ar refill of prazosin for 6 mo pt is unable to get refill on medication due scheduling limitations at psych.pt would like to discuss lipid labs.    Allen Blase Batley. is a 77 y.o. male who presents today for his Annual Wellness Visit. He reports consuming a general diet.  [place] work  He generally feels well. He reports sleeping fairly well. He does not have additional problems to discuss today.   HPI  [facility] but [facility] the [facility]. Pt is out of his Prazosin 5mg  at night and unable to get this refilled until he establishes care with new Psychiatrist. Vision:Within last year and Dental: No current dental problems Opioid Use/abuse: No   Patient Active Problem List   Diagnosis Date Noted   Hx of CABG 04/07/2022   Coronary artery disease involving coronary bypass graft without angina pectoris 04/07/2022   Sinus bradycardia by electrocardiogram 04/07/2022   Statin intolerance 04/07/2022   Sensorineural hearing loss, bilateral 03/09/2022   Obstructive sleep apnea syndrome 03/09/2022   Lumbar and sacral spondyloarthritis 03/09/2022   Insomnia due to mental disorder 03/09/2022   IBS (irritable bowel syndrome) 03/09/2022   Hyperlipidemia 03/09/2022   Former smoker 03/09/2022   Erectile dysfunction 03/09/2022   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 positive airway pressure ventilation 03/09/2022   Combined forms of age-related cataract, bilateral 03/09/2022   Post-traumatic stress disorder, unspecified 03/09/2022   Benign prostatic hyperplasia 03/09/2022   Essential (primary) hypertension 03/09/2022   Multiple fractures of ribs, right side, initial encounter for closed fracture 03/01/2022   Multiple fractures of ribs, left side, initial encounter for closed fracture  03/01/2022   [place] 02/27/2022   Benign prostatic hyperplasia with urinary obstruction 07/23/2020   Multiple adenomatous polyps 11/20/2019   Presence of aortocoronary bypass graft 01/12/2018   NSTEMI (non-ST elevated myocardial infarction) (HCC) 01/12/2005   Past Medical History:  Diagnosis Date   Benign prostatic hyperplasia 03/09/2022   Combined forms of age-related cataract, bilateral 03/09/2022   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 Reports marked improvement after ESI.   Macular degeneration 11/2021   NSTEMI (non-ST elevated myocardial infarction) (HCC) 01/12/2005   Obstructive sleep apnea syndrome 03/09/2022   Post-traumatic stress disorder, unspecified 03/09/2022   Sensorineural hearing loss, bilateral 03/09/2022   Social History   Tobacco Use   Smoking status: Former    Types: Cigarettes   Smokeless tobacco: Current   Tobacco comments:    quit 13 years ago   Substance Use Topics   Alcohol use: Never   Drug use: Never   Family Status  Relation Name Status   Mother  Deceased   Father  Deceased  No partnership data on file   History reviewed. No pertinent family history. Allergies  Allergen Reactions   Rosuvastatin Other (See Comments)   Statins Other (See Comments)  Medications: Outpatient Medications Prior to Visit  Medication Sig   acetaminophen (TYLENOL) 500 MG tablet Take 1,000 mg by mouth every 6 (six) hours as needed.   Alirocumab (PRALUENT) 75 MG/ML SOAJ Inject 1 mL (75 mg total) into the skin every 14 (fourteen) days.   aspirin EC 81 MG tablet Take 1  tablet (81 mg total) by mouth daily. Swallow whole.   celecoxib (CELEBREX) 100 MG capsule Take 100 mg by mouth daily.   cetirizine (ZYRTEC) 10 MG tablet Take 10 mg by mouth at bedtime.   DULoxetine (CYMBALTA) 60 MG capsule Take 60 mg by mouth daily.   Evolocumab (REPATHA SURECLICK) 140 MG/ML SOAJ Inject 140 mg into the skin every 14 (fourteen) days.   finasteride (PROSCAR) 5 MG tablet Take 5 mg by mouth daily.   lisinopril (ZESTRIL) 20 MG tablet Take 0.5 tablets (10 mg total) by mouth daily.   mirtazapine (REMERON SOL-TAB) 45 MG disintegrating tablet Take 67.5 mg by mouth daily.   Probiotic Product (DIGESTIVE ADVANTAGE GUMMIES) CHEW Chew by mouth.   tadalafil (CIALIS) 5 MG tablet Take 5 mg by mouth daily as needed.   [DISCONTINUED] prazosin (MINIPRESS) 5 MG capsule Take 5 mg by mouth at bedtime.   No facility-administered medications prior to visit.    Allergies  Allergen Reactions   Rosuvastatin Other (See Comments)   Statins Other (See Comments)    Patient Care Team: Suzan Slick, MD as PCP - General (Family Medicine) Mallipeddi, Orion Modest, MD as PCP - Cardiology (Cardiology) Center[facility]  Review of Systems  All other systems reviewed and are negative.       Objective  BP 132/83   Pulse 74   Temp 98.4 F (36.9 C) (Oral)   Resp 16   Ht 5\' 11"  (1.803 m)   Wt 238 lb 3.2 oz (108 kg)   SpO2 99%   BMI 33.22 kg/m  BP Readings from Last 3 Encounters:  09/07/22 132/83  05/20/22 131/77  04/07/22 132/80      Physical Exam Vitals and nursing note reviewed.  Constitutional:      Appearance: Normal appearance. He is normal weight.  HENT:     Head: Normocephalic and atraumatic.     Right Ear: Tympanic membrane, ear canal and external ear normal.     Left Ear: Tympanic membrane, ear canal and external ear normal.     Nose: Nose normal.     Mouth/Throat:     Mouth: Mucous membranes are moist.     Pharynx: Oropharynx is clear.  Eyes:      Conjunctiva/sclera: Conjunctivae normal.     Pupils: Pupils are equal, round, and reactive to light.  Cardiovascular:     Rate and Rhythm: Normal rate and regular rhythm.     Pulses: Normal pulses.     Heart sounds: Normal heart sounds.  Pulmonary:     Effort: Pulmonary effort is normal.     Breath sounds: Normal breath sounds.  Abdominal:     General: Abdomen is flat. Bowel sounds are normal.  Skin:    General: Skin is warm.     Capillary Refill: Capillary refill takes less than 2 seconds.  Neurological:     General: No focal deficit present.     Mental Status: He is alert and oriented to person, place, and time. Mental status is at baseline.  Psychiatric:        Mood and Affect: Mood normal.        Behavior:  Behavior normal.        Thought Content: Thought content normal.        Judgment: Judgment normal.      Most recent functional status assessment:    09/07/2022    9:38 AM  In your present state of health, do you have any difficulty performing the following activities:  Hearing? 0  Vision? 0  Difficulty concentrating or making decisions? 0  Walking or climbing stairs? 0  Dressing or bathing? 0  Doing errands, shopping? 0   Most recent fall risk assessment:    09/07/2022    9:39 AM  Fall Risk   Falls in the past year? 0  Number falls in past yr: 0  Injury with Fall? 0  Risk for fall due to : No Fall Risks  Follow up Falls evaluation completed    Most recent depression screenings:     No data to display         Most recent cognitive screening:     No data to display         Most recent Audit-C alcohol use screening     No data to display         A score of 3 or more in women, and 4 or more in men indicates increased risk for alcohol abuse, EXCEPT if all of the points are from question 1   Vision/Hearing Screen: No results found.  Last CBC Lab Results  Component Value Date   WBC 8.3 06/21/2020   HGB 12.6 (L) 06/21/2020   HCT 38.0 (L)  06/21/2020   MCV 101.9 (H) 06/21/2020   MCH 33.8 06/21/2020   RDW 12.6 06/21/2020   PLT 238 06/21/2020   Last metabolic panel Lab Results  Component Value Date   GLUCOSE 120 (H) 06/21/2020   NA 133 (L) 06/21/2020   K 4.6 06/21/2020   CL 99 06/21/2020   CO2 27 06/21/2020   BUN 15 06/21/2020   CREATININE 1.26 (H) 06/21/2020   GFRNONAA 59 (L) 06/21/2020   CALCIUM 8.4 (L) 06/21/2020   ANIONGAP 7 06/21/2020   Last lipids No results found for: "CHOL", "HDL", "LDLCALC", "LDLDIRECT", "TRIG", "CHOLHDL" Last hemoglobin A1c No results found for: "HGBA1C"    No results found for any visits on 09/07/22.    Assessment & Plan   Annual wellness visit done today including the all of the following: Reviewed patient's Family Medical History Reviewed and updated list of patient's medical providers Assessment of cognitive impairment was done Assessed patient's functional ability Established a written schedule for health screening services Health Risk Assessent Completed and Reviewed  Exercise Activities and Dietary recommendations  Goals   None     Immunization History  Administered Date(s) Administered   Fluad Quad(high Dose 65+) 11/10/2021   Influenza Inj Mdck Quad Pf 02/24/2021   Influenza, High Dose Seasonal PF 12/23/2018, 12/23/2018   Influenza-Unspecified 10/13/2019, 10/20/2020   Pneumococcal Conjugate-13 06/25/2014   Tdap 04/08/2012, 02/23/2013    Health Maintenance  Topic Date Due   Zoster Vaccines- Shingrix (1 of 2) Never done   Pneumonia Vaccine 58+ Years old (2 of 2 - PPSV23 or PCV20) 06/25/2015   INFLUENZA VACCINE  08/13/2022   DTaP/Tdap/Td (3 - Td or Tdap) 02/24/2023   Lung Cancer Screening  02/28/2023   Medicare Annual Wellness (AWV)  09/07/2023   Hepatitis C Screening  Completed   HPV VACCINES  Aged Out   COVID-19 Vaccine  Discontinued     Discussed health benefits of  physical activity, and encouraged him to engage in regular exercise appropriate for his  age and condition.    Problem List Items Addressed This Visit       Other   Post-traumatic stress disorder, unspecified   Relevant Medications   prazosin (MINIPRESS) 5 MG capsule   Other Visit Diagnoses     Medicare annual wellness visit, subsequent    -  Primary   Encounter for lipid screening for cardiovascular disease       Relevant Orders   Lipid panel   Impaired glucose tolerance       Relevant Orders   CBC with Differential/Platelet   Comprehensive metabolic panel   Hemoglobin A1c     Screening labs for AWV Refilled Prazosin 5 mg daily  Return in about 6 months (around 03/10/2023) for Chronic condition follow up.     Suzan Slick, MD

## 2022-09-08 ENCOUNTER — Encounter: Payer: Self-pay | Admitting: Family Medicine

## 2022-09-08 LAB — COMPREHENSIVE METABOLIC PANEL
ALT: 17 IU/L (ref 0–44)
AST: 22 IU/L (ref 0–40)
Albumin: 4.2 g/dL (ref 3.8–4.8)
Alkaline Phosphatase: 130 IU/L — ABNORMAL HIGH (ref 44–121)
BUN/Creatinine Ratio: 16 (ref 10–24)
BUN: 19 mg/dL (ref 8–27)
Bilirubin Total: 0.3 mg/dL (ref 0.0–1.2)
CO2: 21 mmol/L (ref 20–29)
Calcium: 9.4 mg/dL (ref 8.6–10.2)
Chloride: 101 mmol/L (ref 96–106)
Creatinine, Ser: 1.17 mg/dL (ref 0.76–1.27)
Globulin, Total: 3.4 g/dL (ref 1.5–4.5)
Glucose: 167 mg/dL — ABNORMAL HIGH (ref 70–99)
Potassium: 4.5 mmol/L (ref 3.5–5.2)
Sodium: 139 mmol/L (ref 134–144)
Total Protein: 7.6 g/dL (ref 6.0–8.5)
eGFR: 64 mL/min/{1.73_m2} (ref >59)

## 2022-09-08 LAB — HEMOGLOBIN A1C
Est. average glucose Bld gHb Est-mCnc: 123 mg/dL
Hgb A1c MFr Bld: 5.9 % — ABNORMAL HIGH (ref 4.8–5.6)

## 2022-09-08 LAB — CBC WITH DIFFERENTIAL/PLATELET
Basophils Absolute: 0 10*3/uL (ref 0.0–0.2)
Basos: 0 %
EOS (ABSOLUTE): 0.1 10*3/uL (ref 0.0–0.4)
Eos: 1 %
Hematocrit: 42.5 % (ref 37.5–51.0)
Hemoglobin: 14.4 g/dL (ref 13.0–17.7)
Immature Grans (Abs): 0 10*3/uL (ref 0.0–0.1)
Immature Granulocytes: 1 %
Lymphocytes Absolute: 2.2 10*3/uL (ref 0.7–3.1)
Lymphs: 31 %
MCH: 32.1 pg (ref 26.6–33.0)
MCHC: 33.9 g/dL (ref 31.5–35.7)
MCV: 95 fL (ref 79–97)
Monocytes Absolute: 0.4 10*3/uL (ref 0.1–0.9)
Monocytes: 6 %
Neutrophils Absolute: 4.3 10*3/uL (ref 1.4–7.0)
Neutrophils: 61 %
Platelets: 232 10*3/uL (ref 150–450)
RBC: 4.48 x10E6/uL (ref 4.14–5.80)
RDW: 12.6 % (ref 11.6–15.4)
WBC: 7.1 10*3/uL (ref 3.4–10.8)

## 2022-09-08 LAB — LIPID PANEL
Chol/HDL Ratio: 9.3 ratio — ABNORMAL HIGH (ref 0.0–5.0)
Cholesterol, Total: 243 mg/dL — ABNORMAL HIGH (ref 100–199)
HDL: 26 mg/dL — ABNORMAL LOW (ref >39)
LDL Chol Calc (NIH): 141 mg/dL — ABNORMAL HIGH (ref 0–99)
Triglycerides: 409 mg/dL — ABNORMAL HIGH (ref 0–149)
VLDL Cholesterol Cal: 76 mg/dL — ABNORMAL HIGH (ref 5–40)

## 2022-09-10 ENCOUNTER — Other Ambulatory Visit: Payer: Self-pay

## 2022-09-10 ENCOUNTER — Telehealth: Payer: Self-pay

## 2022-09-10 DIAGNOSIS — E785 Hyperlipidemia, unspecified: Secondary | ICD-10-CM

## 2022-09-10 DIAGNOSIS — I214 Non-ST elevation (NSTEMI) myocardial infarction: Secondary | ICD-10-CM

## 2022-09-10 DIAGNOSIS — I25810 Atherosclerosis of coronary artery bypass graft(s) without angina pectoris: Secondary | ICD-10-CM

## 2022-09-10 MED ORDER — REPATHA SURECLICK 140 MG/ML ~~LOC~~ SOAJ
1.0000 mL | SUBCUTANEOUS | 6 refills | Status: DC
Start: 2022-09-10 — End: 2023-03-22

## 2022-09-10 MED ORDER — REPATHA SURECLICK 140 MG/ML ~~LOC~~ SOAJ
1.0000 mL | SUBCUTANEOUS | 6 refills | Status: DC
Start: 2022-09-10 — End: 2023-03-17

## 2022-09-10 NOTE — Telephone Encounter (Signed)
Called patient left detailed message per DPR. Placed order for fasting lipid panel. Also advised him to call office to update [place] whether he was still taking his Repatha twice a month. Copy sent to PCP.

## 2022-09-10 NOTE — Telephone Encounter (Signed)
Spoke with wife Derrell Kaupp) stated that patient has not been taking his Repatha since its been prescribed. Was an issue with pharmacy, but requested it to be sent to another pharmacy. [facility]. Advised her that I have placed an order for fasting lipids and mailed the order to her. Will have completed once he has taken the medication. Patient verbalized understanding.

## 2022-09-10 NOTE — Telephone Encounter (Signed)
-----   Message from Vishnu P Mallipeddi sent at 09/09/2022 12:15 PM EDT ----- LDL continues to be elevated 141 and TG elevated at 409.  Per chart review, he was started on PCSK9 inhibitors in 05/2022.  Obtain a fasting lipid panel (if this result is not performed while fasting) and ask if patient has been taking PCSK9 inhibitor injection twice a month.

## 2022-09-10 NOTE — Telephone Encounter (Signed)
-----   Message from Allen Hill sent at 09/09/2022 12:15 PM EDT ----- LDL continues to be elevated 141 and TG elevated at 409.  Per chart review, he was started on PCSK9 inhibitors in 05/2022.  Obtain a fasting lipid panel (if this result is not performed while fasting) and ask if patient has been taking PCSK9 inhibitor injection twice a month.

## 2023-02-19 ENCOUNTER — Other Ambulatory Visit: Payer: Self-pay | Admitting: Family Medicine

## 2023-02-19 DIAGNOSIS — F4310 Post-traumatic stress disorder, unspecified: Secondary | ICD-10-CM

## 2023-03-10 ENCOUNTER — Ambulatory Visit: Payer: Medicare Other | Admitting: Family Medicine

## 2023-03-17 ENCOUNTER — Ambulatory Visit (INDEPENDENT_AMBULATORY_CARE_PROVIDER_SITE_OTHER): Payer: Medicare Other | Admitting: Family Medicine

## 2023-03-17 ENCOUNTER — Encounter: Payer: Self-pay | Admitting: Family Medicine

## 2023-03-17 VITALS — BP 146/72 | HR 63 | Temp 97.7°F | Resp 18 | Ht 71.0 in | Wt 242.4 lb

## 2023-03-17 DIAGNOSIS — E782 Mixed hyperlipidemia: Secondary | ICD-10-CM | POA: Diagnosis not present

## 2023-03-17 DIAGNOSIS — Z789 Other specified health status: Secondary | ICD-10-CM | POA: Diagnosis not present

## 2023-03-17 NOTE — Progress Notes (Signed)
   Established Patient Office Visit  Subjective   Patient ID: Allen Berthelot., male    DOB: 1945/11/13  Age: 78 y.o. MRN: [id number]  Chief Complaint  Patient presents with   Medical Management of Chronic Issues    Patient is here for a 6 month follow up    HPI  HLD Pt has hx of HLD and CAD. He has been intolerant to statins. He has been on Repatha injection every 2 weeks and tolerating well. Pt is fasting this morning.   Review of Systems  All other systems reviewed and are negative.     Objective:     BP (!) 146/72   Pulse 63   Temp 97.7 F (36.5 C) (Oral)   Resp 18   Ht 5\' 11"  (1.803 m)   Wt 242 lb 6.4 oz (110 kg)   SpO2 97%   BMI 33.81 kg/m  BP Readings from Last 3 Encounters:  03/17/23 (!) 146/72  09/07/22 132/83  05/20/22 131/77      Physical Exam Vitals and nursing note reviewed.  Constitutional:      Appearance: Normal appearance. He is normal weight.  HENT:     Head: Normocephalic and atraumatic.     Right Ear: External ear normal.     Left Ear: External ear normal.     Nose: Nose normal.     Mouth/Throat:     Mouth: Mucous membranes are moist.     Pharynx: Oropharynx is clear.  Eyes:     Conjunctiva/sclera: Conjunctivae normal.     Pupils: Pupils are equal, round, and reactive to light.  Cardiovascular:     Rate and Rhythm: Normal rate.  Pulmonary:     Effort: Pulmonary effort is normal.  Skin:    General: Skin is warm.     Capillary Refill: Capillary refill takes less than 2 seconds.  Neurological:     General: No focal deficit present.     Mental Status: He is alert and oriented to person, place, and time. Mental status is at baseline.  Psychiatric:        Mood and Affect: Mood normal.        Behavior: Behavior normal.        Thought Content: Thought content normal.        Judgment: Judgment normal.      No results found for any visits on 03/17/23.  Last lipids Lab Results  Component Value Date   CHOL 243 (H) 09/07/2022    HDL 26 (L) 09/07/2022   LDLCALC 141 (H) 09/07/2022   TRIG 409 (H) 09/07/2022   CHOLHDL 9.3 (H) 09/07/2022      The ASCVD Risk score (Arnett DK, et al., 2019) failed to calculate for the following reasons:   Risk score cannot be calculated because patient has a medical history suggesting prior/existing ASCVD    Assessment & Plan:   Problem List Items Addressed This Visit   None Mixed hyperlipidemia -     Lipid panel  Statin intolerance -     Lipid panel   Recheck lipid panel today.  [facility]  No follow-ups on file.    Suzan Slick, MD

## 2023-03-18 ENCOUNTER — Encounter: Payer: Self-pay | Admitting: Family Medicine

## 2023-03-18 LAB — LIPID PANEL
Chol/HDL Ratio: 3 ratio (ref 0.0–5.0)
Cholesterol, Total: 109 mg/dL (ref 100–199)
HDL: 36 mg/dL — ABNORMAL LOW (ref >39)
LDL Chol Calc (NIH): 43 mg/dL (ref 0–99)
Triglycerides: 179 mg/dL — ABNORMAL HIGH (ref 0–149)
VLDL Cholesterol Cal: 30 mg/dL (ref 5–40)

## 2023-03-20 ENCOUNTER — Other Ambulatory Visit: Payer: Self-pay | Admitting: Internal Medicine

## 2023-03-20 DIAGNOSIS — I214 Non-ST elevation (NSTEMI) myocardial infarction: Secondary | ICD-10-CM

## 2023-03-20 DIAGNOSIS — I25810 Atherosclerosis of coronary artery bypass graft(s) without angina pectoris: Secondary | ICD-10-CM

## 2023-03-20 DIAGNOSIS — E785 Hyperlipidemia, unspecified: Secondary | ICD-10-CM

## 2023-09-08 ENCOUNTER — Encounter: Admitting: Family Medicine

## 2023-10-04 ENCOUNTER — Encounter: Admitting: Family Medicine

## 2023-11-04 ENCOUNTER — Ambulatory Visit: Admitting: Family Medicine

## 2023-11-10 ENCOUNTER — Ambulatory Visit: Attending: Internal Medicine | Admitting: Internal Medicine

## 2023-11-10 NOTE — Progress Notes (Signed)
 Erroneous encounter - please disregard.

## 2023-11-19 ENCOUNTER — Ambulatory Visit: Admitting: Internal Medicine

## 2023-12-22 ENCOUNTER — Ambulatory Visit: Payer: Self-pay | Admitting: Internal Medicine

## 2023-12-22 ENCOUNTER — Ambulatory Visit: Admitting: Internal Medicine

## 2023-12-22 ENCOUNTER — Other Ambulatory Visit (HOSPITAL_COMMUNITY)
Admission: RE | Admit: 2023-12-22 | Discharge: 2023-12-22 | Disposition: A | Discharge status: Home / Self Care | Source: Ambulatory Visit | Attending: Internal Medicine | Admitting: Internal Medicine

## 2023-12-22 ENCOUNTER — Encounter: Payer: Self-pay | Admitting: Internal Medicine

## 2023-12-22 VITALS — BP 142/78 | HR 54 | Ht 71.0 in | Wt 242.4 lb

## 2023-12-22 DIAGNOSIS — F4310 Post-traumatic stress disorder, unspecified: Secondary | ICD-10-CM

## 2023-12-22 DIAGNOSIS — R0609 Other forms of dyspnea: Secondary | ICD-10-CM | POA: Insufficient documentation

## 2023-12-22 DIAGNOSIS — I25810 Atherosclerosis of coronary artery bypass graft(s) without angina pectoris: Secondary | ICD-10-CM | POA: Insufficient documentation

## 2023-12-22 DIAGNOSIS — I10 Essential (primary) hypertension: Secondary | ICD-10-CM

## 2023-12-22 DIAGNOSIS — I214 Non-ST elevation (NSTEMI) myocardial infarction: Secondary | ICD-10-CM | POA: Diagnosis present

## 2023-12-22 LAB — PRO BRAIN NATRIURETIC PEPTIDE: Pro Brain Natriuretic Peptide: 226 pg/mL (ref <300.0)

## 2023-12-22 MED ORDER — PRAZOSIN HCL 2 MG PO CAPS: 2.0000 mg | ORAL_CAPSULE | Freq: Every day | ORAL | Status: AC

## 2023-12-22 NOTE — Progress Notes (Signed)
 Cardiology Office Note  Date: 12/22/2023   ID: Allen Hill., DOB May 14, 1945, MRN [id number]  PCP:  Allen Hill GRADE, MD  Cardiologist:  Allen Allen Maywood, MD Electrophysiologist:  None   Reason for Office Visit: Establish care at the request of Dr. Colette   History of Present Illness: Allen Hill. is a 78 y.o. male known to have CAD manifested by NSTEMI s/p 4v CABG in 2007, HTN, DM 2, HLD, OSA is here for follow-up visit of CAD, HLD.  Patient has chest congestion in 2007 and underwent CABG. No LHC/PCI since 2007. No symptoms of angina or DOE since then. He underwent NM stress test in 2019 for preop evaluation which showed medium sized, moderate intensity, apical to basal inferolateral scar and no ischemia. Nuclear LVEF was 49%. This study was intermediate risk due to LVEF.  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  Due to statin intolerance, he was started on Repatha .  Patient is here today for follow-up visit.  Accompanied by wife.  Does not have any angina.  He gets short of breath when he walks uphill.  Otherwise no issues.  No orthopnea, PND, leg swelling.  No dizziness, syncope, palpitations.  Lipid panel from March 2025 reviewed.  LDL 43, TG 179.  LDL within normal limits.  TG mildly elevated.  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 Reports marked improvement after ESI.   Macular degeneration 11/2021   NSTEMI (non-ST elevated myocardial infarction) (HCC) 01/12/2005   Obstructive sleep apnea syndrome 03/09/2022   Post-traumatic stress disorder, unspecified 03/09/2022   Sensorineural hearing loss, bilateral 03/09/2022    Past Surgical History:  Procedure Laterality Date   BACK SURGERY     CARDIAC SURGERY  2007   CABG   CATARACT EXTRACTION Right 10/2021   CATARACT EXTRACTION Left 11/2021   EYE SURGERY      Current Outpatient Medications  Medication Sig Dispense Refill   acetaminophen (TYLENOL) 500 MG tablet Take 1,000 mg by mouth every 6 (six) hours as needed.     aspirin  EC 81 MG tablet Take 1 tablet (81 mg total) by mouth daily. Swallow whole. 90 tablet 3   cetirizine (ZYRTEC) 10 MG tablet Take 10 mg by mouth as needed.     DULoxetine (CYMBALTA) 60 MG capsule Take 60 mg by mouth daily.     finasteride (PROSCAR) 5 MG tablet Take 5 mg by mouth daily.     lisinopril  (ZESTRIL ) 20 MG tablet Take 0.5 tablets (10 mg total) by mouth daily. 45 tablet 0   mirtazapine (REMERON) 30 MG tablet Take 45 mg by mouth at bedtime.     Multiple Vitamin (MULTIVITAMIN ADULT PO) Take 1 tablet by mouth daily.     Multiple Vitamins-Minerals (PRESERVISION AREDS PO) Take 1 tablet by mouth 2 (two) times daily.     prazosin  (MINIPRESS ) 2 MG capsule Take 1 capsule (2  mg total) by mouth at bedtime.     Probiotic Product (DIGESTIVE ADVANTAGE GUMMIES) CHEW Chew by mouth.     REPATHA  SURECLICK 140 MG/ML SOAJ INJECT 140 MG INTO THE SKIN EVERY 14 (FOURTEEN) DAYS. 6 mL 3   tadalafil (CIALIS) 5 MG tablet Take 5 mg by mouth daily.     No current facility-administered medications for this visit.   Allergies:  Ezetimibe, Rosuvastatin, and Statins   Social History: The patient  reports that he has quit smoking. His smoking use included cigarettes. He uses smokeless tobacco. He reports that he does not drink alcohol and does not use drugs.    Family History: The patient's family history is not on file.   ROS:  Please see the history of present illness. Otherwise, complete review of systems is positive for none.  All other systems are reviewed and negative.   Physical Exam: VS:  BP (!) 142/78   Pulse (!) 54   Ht 5' 11 (1.803 m)   Wt 242 lb 6.4 oz (110 kg)   SpO2 95%   BMI 33.81 kg/m , BMI Body mass index is 33.81 kg/m.  Wt Readings from Last 3 Encounters:  12/22/23 242 lb 6.4 oz (110 kg)  03/17/23 242 lb 6.4 oz (110 kg)  09/07/22 238 lb 3.2 oz (108 kg)    General: Patient appears comfortable at rest. HEENT: Conjunctiva and lids normal, oropharynx clear with moist mucosa. Neck: Supple, no elevated JVP or carotid bruits, no thyromegaly. Lungs: Clear to auscultation, nonlabored breathing at rest. Cardiac: Regular rate and rhythm, no S3 or significant systolic murmur, no pericardial rub. Abdomen: Soft, nontender, no hepatomegaly, bowel sounds present, no guarding or rebound. Extremities: No pitting edema, distal pulses 2+. Skin: Warm and dry. Musculoskeletal: No kyphosis. Neuropsychiatric: Alert and oriented x3, affect grossly appropriate.  ECG:  NSR  Recent Labwork: No results found for requested labs within last 365 days.     Component Value Date/Time   CHOL 109 03/17/2023 1114   TRIG 179 (H) 03/17/2023 1114   HDL 36 (L) 03/17/2023 1114   CHOLHDL 3.0 03/17/2023 1114   LDLCALC 43 03/17/2023 1114    Other Studies Reviewed Today: NM stress test in 2019 No diagnostic ST segment changes to indicate ischemia. Medium sized, moderate intensity, apical to basal inferolateral defect most consistent with scar. No definite ischemia. This is an intermediate risk study. Nuclear stress EF: 49%. Consider echocardiogram for further assessment of LVEF.  Assessment and Plan:  # CAD manifested by NSTEMI s/p 4v CABG in 2007 - Continue aspirin  81 mg once daily. - Statin intolerant, continue Repatha . - Previously on BB  and ACEi for chronic CAD.  Discontinued BB due to bradycardia.  Continue lisinopril  10 mg once daily for HTN control. - Not on SL NTG due to concurrent use of tadalafil. - Echocardiogram in 2024 showed normal LVEF, mild MR from mitral valve prolapse and CVP 15 mmHg.  # HLD, at goal # Statin intolerance - Statin intolerant (simvastatin and rosuvastatin, had myalgias). Goal LDL less than 70.  Continue Repatha . Lipid panel from March 2025 reviewed.  LDL 43, TG 179.  LDL within normal limits.  TG mildly elevated.  # DOE - SOB only with uphill.  CVP 15 mmHg on echo from 2024. Obtain BNP.    # HTN, controlled - Continue lisinopril  10 mg once daily.   I have spent a total of 30 minutes with patient reviewing records, reports, more than 3 labs, discussion and documentation.  Medication Adjustments/Labs and Tests Ordered: Current medicines are reviewed at length with the patient today.  Concerns regarding medicines are outlined above.   Tests Ordered: Orders Placed This Encounter  Procedures   EKG 12-Lead     Medication Changes: Meds ordered this encounter  Medications   prazosin  (MINIPRESS ) 2 MG capsule    Sig: Take 1 capsule (2 mg total) by mouth at bedtime.    This prescription was filled on 12/01/2022. Any refills authorized will be placed on file.    Disposition:  Follow up 1 year  Signed Allen Hill Arleta Maywood, MD, 12/22/2023 10:41 AM    [facility] [address] [place], [place], [place] [address]

## 2023-12-22 NOTE — Patient Instructions (Signed)
 Medication Instructions:  Your physician recommends that you continue on your current medications as directed. Please refer to the Current Medication list given to you today.  *If you need a refill on your cardiac medications before your next appointment, please call your pharmacy*  Lab Work: BNP  If you have labs (blood work) drawn today and your tests are completely normal, you will receive your results only by: MyChart Message (if you have MyChart) OR A paper copy in the mail If you have any lab test that is abnormal or we need to change your treatment, we will call you to review the results.  Testing/Procedures: None  Follow-Up: At [facility], you and your health needs are our priority.  As part of our continuing mission to provide you with exceptional heart care, our providers are all part of one team.  This team includes your primary Cardiologist (physician) and Advanced Practice Providers or APPs (Physician Assistants and Nurse Practitioners) who all work together to provide you with the care you need, when you need it.  Your next appointment:   1 year(s)  Provider:   You may see Vishnu P Mallipeddi, MD or one of the following Advanced Practice Providers on your designated Care Team:   Brittany Strader, PA-C  Scotesia Dennard, [place] Olivia Pavy, [place]     We recommend signing up for the patient portal called MyChart.  Sign up information is provided on this After Visit Summary.  MyChart is used to connect with patients for Virtual Visits (Telemedicine).  Patients are able to view lab/test results, encounter notes, upcoming appointments, etc.  Non-urgent messages can be sent to your provider as well.   To learn more about what you can do with MyChart, go to [email or web address].   Other Instructions

## 2023-12-23 ENCOUNTER — Telehealth: Payer: Self-pay | Admitting: Internal Medicine

## 2023-12-23 NOTE — Telephone Encounter (Signed)
-----   Message from Vishnu Mallipeddi, MD sent at 12/22/2023 12:55 PM EST ----- Normal pro-BNP. No fluid overload. ----- Message ----- From: Interface, Lab In [place] Sent: 12/22/2023  12:04 PM EST To: Vishnu P Mallipeddi, MD

## 2023-12-23 NOTE — Telephone Encounter (Signed)
 The patient has been notified of the result and verbalized understanding.  All questions (if any) were answered. Rosina JAYSON Cornea, CMA 12/23/2023 10:08 AM

## 2023-12-23 NOTE — Telephone Encounter (Signed)
 Wife Arlin) returned staff call regarding lab results.

## 2024-02-17 ENCOUNTER — Ambulatory Visit: Admitting: Family Medicine
